# Patient Record
Sex: Male | Born: 1950 | Race: Asian | Hispanic: No | Marital: Married | Smoking: Never smoker
Health system: Southern US, Community
[De-identification: ages and names within clinical notes are randomized; demographics above are authoritative.]

## PROBLEM LIST (undated history)

## (undated) DIAGNOSIS — J984 Other disorders of lung: Secondary | ICD-10-CM

## (undated) DIAGNOSIS — E871 Hypo-osmolality and hyponatremia: Secondary | ICD-10-CM

## (undated) DIAGNOSIS — I10 Essential (primary) hypertension: Secondary | ICD-10-CM

## (undated) HISTORY — DX: Hypo-osmolality and hyponatremia: E87.1

## (undated) HISTORY — DX: Other disorders of lung: J98.4

## (undated) HISTORY — DX: Essential (primary) hypertension: I10

---

## 2013-10-22 ENCOUNTER — Encounter: Payer: Self-pay | Admitting: Critical Care Medicine

## 2013-10-23 ENCOUNTER — Ambulatory Visit (HOSPITAL_BASED_OUTPATIENT_CLINIC_OR_DEPARTMENT_OTHER)
Admission: RE | Admit: 2013-10-23 | Discharge: 2013-10-23 | Disposition: A | Discharge status: Home / Self Care | Payer: No Typology Code available for payment source | Source: Ambulatory Visit | Attending: Critical Care Medicine | Admitting: Critical Care Medicine

## 2013-10-23 ENCOUNTER — Other Ambulatory Visit: Payer: Self-pay | Admitting: Critical Care Medicine

## 2013-10-23 ENCOUNTER — Encounter: Payer: Self-pay | Admitting: Critical Care Medicine

## 2013-10-23 ENCOUNTER — Ambulatory Visit (INDEPENDENT_AMBULATORY_CARE_PROVIDER_SITE_OTHER): Payer: No Typology Code available for payment source | Admitting: Critical Care Medicine

## 2013-10-23 VITALS — BP 156/80 | HR 75 | Temp 98.2°F | Ht 68.0 in | Wt 171.0 lb

## 2013-10-23 DIAGNOSIS — J984 Other disorders of lung: Secondary | ICD-10-CM

## 2013-10-23 DIAGNOSIS — I10 Essential (primary) hypertension: Secondary | ICD-10-CM | POA: Insufficient documentation

## 2013-10-23 LAB — CBC
HEMATOCRIT: 38.7 % — AB (ref 39.0–52.0)
HEMOGLOBIN: 13 g/dL (ref 13.0–17.0)
MCH: 26.5 pg (ref 26.0–34.0)
MCHC: 33.6 g/dL (ref 30.0–36.0)
MCV: 79 fL (ref 78.0–100.0)
Platelets: 214 10*3/uL (ref 150–400)
RBC: 4.9 MIL/uL (ref 4.22–5.81)
RDW: 16.9 % — ABNORMAL HIGH (ref 11.5–15.5)
WBC: 6.9 10*3/uL (ref 4.0–10.5)

## 2013-10-23 NOTE — Patient Instructions (Signed)
Chest xray and labs today STay on antibiotic to completion Return 2 months

## 2013-10-23 NOTE — Progress Notes (Signed)
Subjective:    Patient ID: Jacob Park, male    DOB: 03/14/1951, 63 y.o.   MRN: [id number]  HPI Comments: Recent admission for PNA [place].  In [place] 1[place]5 [place]. Recently [facility] for PNA.  Pt just d/c 20days ago. Still with PICC line.  Pt gets IV ABX every 2-3 days. Admitted 5/19 d/c 5/28 with cavitary pneumonia.  Bx done with PTX.  No insurance so could not get in with [facility]. Cult Pos MSSA,  4 weeks of IV ABX per ID. Ancef q8 x 4 weeks [facility] Now is better , pt was ill for one week prior to hosp   Shortness of Breath This is a new problem. The current episode started more than 1 month ago. The problem occurs daily. The problem has been rapidly improving. Pertinent negatives include no abdominal pain, chest pain, fever, headaches, hemoptysis, leg pain, leg swelling, orthopnea, PND, sore throat, sputum production, swollen glands, syncope, vomiting or wheezing. Nothing aggravates the symptoms. Associated symptoms comments: No dyspnea . Risk factors: uses snuff  He has tried beta agonist inhalers for the symptoms. The treatment provided significant relief. His past medical history is significant for pneumonia. There is no history of asthma, bronchiolitis, CAD, chronic lung disease, COPD, DVT, a heart failure or PE.    Past Medical History  Diagnosis Date  . Cavitary lesion of lung   . Hypertension   . Hyponatremia      No family history on file.   History   Social History  . Marital Status: Married    Spouse Name: N/A    Number of Children: N/A  . Years of Education: N/A   Occupational History  . Systems developerfield worker, homebuilder     [place]   Social History Main Topics  . Smoking status: Never Smoker   . Smokeless tobacco: Current User    Types: Snuff  . Alcohol Use: Not on file  . Drug Use: Not on file  . Sexual Activity: Not on file   Other Topics Concern  . Not on file   Social History Narrative  . No narrative on file     No Known Allergies   Outpatient  Prescriptions Prior to Visit  Medication Sig Dispense Refill  . ibuprofen (ADVIL,MOTRIN) 800 MG tablet Take 800 mg by mouth 3 (three) times daily.      Marland Kitchen. lisinopril (PRINIVIL,ZESTRIL) 10 MG tablet Take 10 mg by mouth daily.       No facility-administered medications prior to visit.      Review of Systems  Constitutional: Negative for fever.  HENT: Negative for sore throat.   Respiratory: Positive for shortness of breath. Negative for hemoptysis, sputum production and wheezing.   Cardiovascular: Negative for chest pain, orthopnea, leg swelling, syncope and PND.  Gastrointestinal: Negative for vomiting and abdominal pain.  Neurological: Negative for headaches.       Objective:   Physical Exam Filed Vitals:   10/23/13 1158  BP: 156/80  Pulse: 75  Temp: 98.2 F (36.8 C)  TempSrc: Oral  Height: 5\' 8"  (1.727 m)  Weight: 171 lb (77.565 kg)  SpO2: 98%    Gen: Pleasant, well-nourished, in no distress,  normal affect  ENT: No lesions,  mouth clear,  oropharynx clear, no postnasal drip  Neck: No JVD, no TMG, no carotid bruits  Lungs: No use of accessory muscles, no dullness to percussion, clear without rales or rhonchi  Cardiovascular: RRR, heart sounds normal, no murmur or gallops, no  peripheral edema, PICC line in LUE  Abdomen: soft and NT, no HSM,  BS normal  Musculoskeletal: No deformities, no cyanosis or clubbing  Neuro: alert, non focal  Skin: Warm, no lesions or rashes  Dg Chest 2 View  10/23/2013   CLINICAL DATA:  Cavitary lesion of the left lung with history of pneumonia  EXAM: CHEST  2 VIEW  COMPARISON:  None.  FINDINGS: There is parenchymal density in the left infrahilar region laterally. This likely lies in the anterior aspect of the left lower lobe. There are coarse lung markings in the right infrahilar region. The heart and pulmonary vascularity appear normal. There is no pleural effusion.  IMPRESSION: Patchy pulmonary parenchymal densities with near confluence  in the left lower lobe are noted. Chest CT scanning is recommended given the patient's history.   Electronically Signed   By: David  [email or web address]   On: 10/23/2013 13:45          Assessment & Plan:   Cavitary lesion of lung LLL cavitary Mssa PNA, poor dentition Plan Needs dental eval Finish IV ABX Ancef No other changes Return 6 weeks   Updated Medication List Outpatient Encounter Prescriptions as of 10/23/2013  Medication Sig  . albuterol (PROVENTIL HFA;VENTOLIN HFA) 108 (90 BASE) MCG/ACT inhaler Inhale into the lungs every 6 (six) hours as needed for wheezing or shortness of breath.  Marland Kitchen. ceFAZolin (ANCEF) 1 G injection Inject 2 g into the muscle every 8 (eight) hours.  . metoprolol tartrate (LOPRESSOR) 25 MG tablet Take 12.5 mg by mouth daily.  . [DISCONTINUED] ibuprofen (ADVIL,MOTRIN) 800 MG tablet Take 800 mg by mouth 3 (three) times daily.  . [DISCONTINUED] lisinopril (PRINIVIL,ZESTRIL) 10 MG tablet Take 10 mg by mouth daily.

## 2013-10-25 NOTE — Assessment & Plan Note (Signed)
LLL cavitary Mssa PNA, poor dentition Plan Needs dental eval Finish IV ABX Ancef No other changes Return 6 weeks

## 2014-01-01 ENCOUNTER — Ambulatory Visit: Payer: No Typology Code available for payment source | Admitting: Critical Care Medicine

## 2015-05-11 IMAGING — CR DG CHEST 2V
2 series · 2 of 2 positions shown · non-contrast
Comparison: None.

CLINICAL DATA: Cavitary lesion of the left lung with history of
pneumonia

EXAM:
CHEST  2 VIEW

[w chest pa]
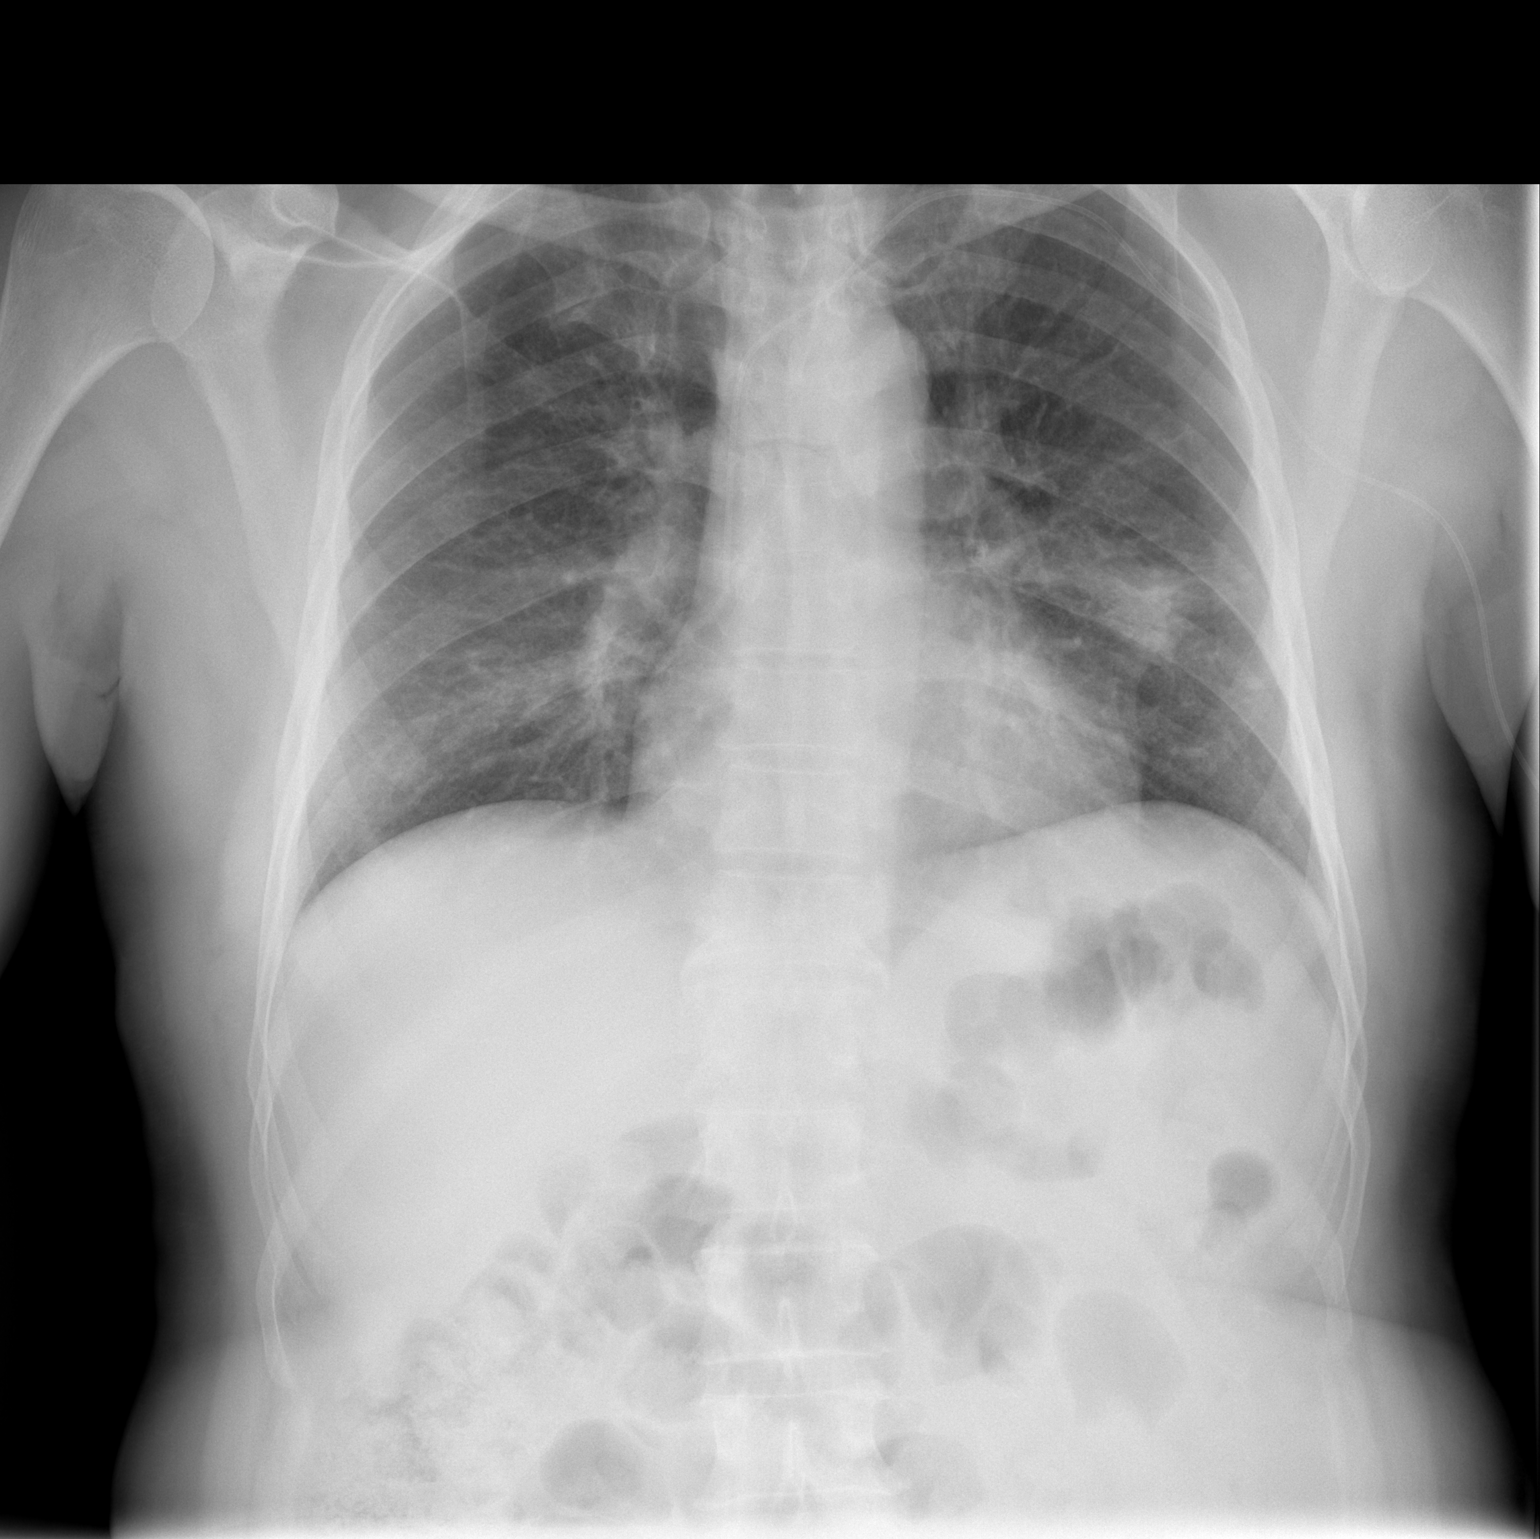

[w chest lat]
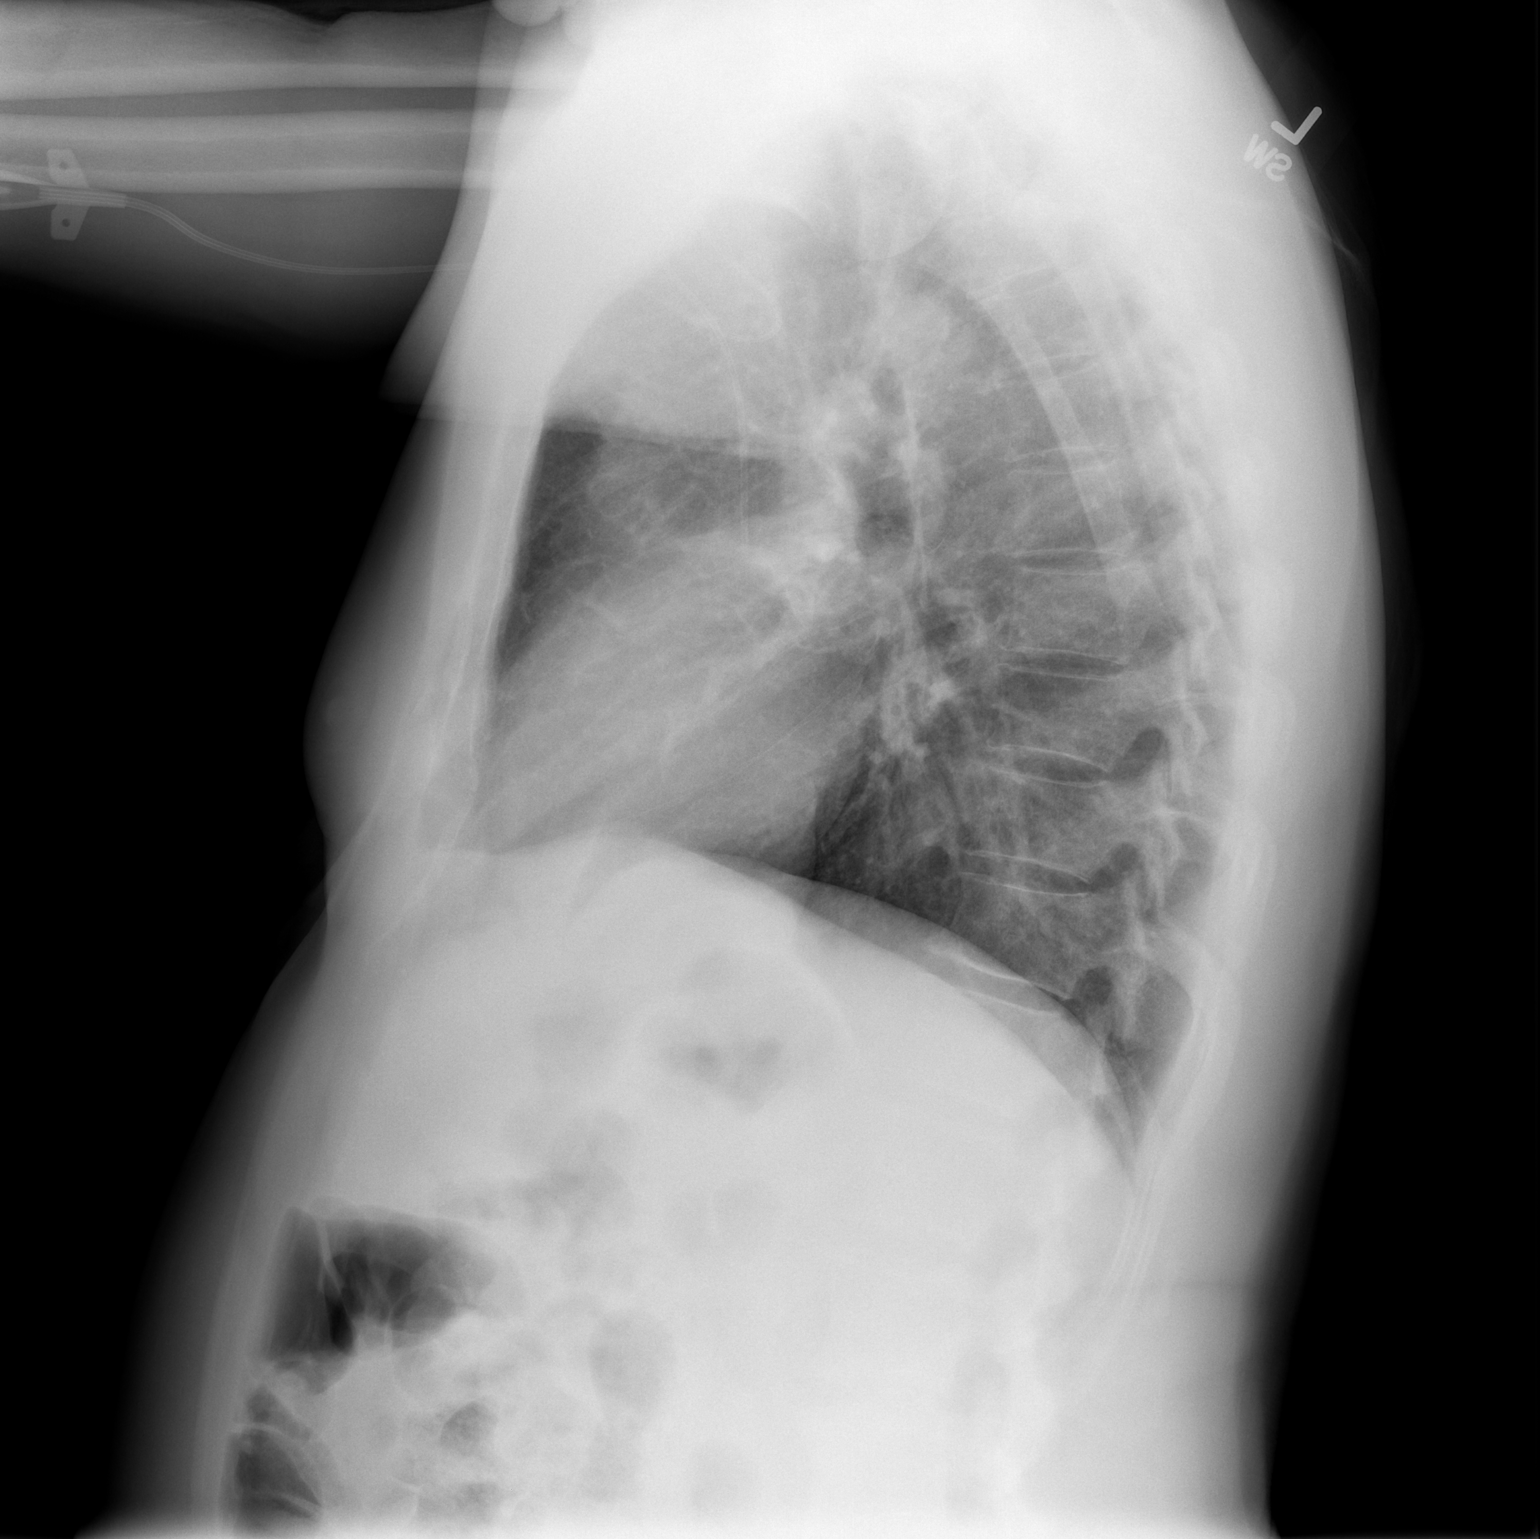

[2 of 2 positions shown; findings below may reference images not displayed]

FINDINGS: There is parenchymal density in the left infrahilar region
laterally. This likely lies in the anterior aspect of the left lower
lobe. There are coarse lung markings in the right infrahilar region.
The heart and pulmonary vascularity appear normal. There is no
pleural effusion.
IMPRESSION: Patchy pulmonary parenchymal densities with near confluence in the
left lower lobe are noted. Chest CT scanning is recommended given
the patient's history.
